# Patient Record
Sex: Male | Born: 2005
Health system: Southern US, Community
[De-identification: ages and names within clinical notes are randomized; demographics above are authoritative.]

## PROBLEM LIST (undated history)

## (undated) DIAGNOSIS — R404 Transient alteration of awareness: Secondary | ICD-10-CM

## (undated) DIAGNOSIS — F909 Attention-deficit hyperactivity disorder, unspecified type: Secondary | ICD-10-CM

## (undated) DIAGNOSIS — R55 Syncope and collapse: Secondary | ICD-10-CM

## (undated) DIAGNOSIS — T7840XA Allergy, unspecified, initial encounter: Secondary | ICD-10-CM

## (undated) DIAGNOSIS — R480 Dyslexia and alexia: Secondary | ICD-10-CM

## (undated) DIAGNOSIS — R278 Other lack of coordination: Secondary | ICD-10-CM

## (undated) DIAGNOSIS — H9325 Central auditory processing disorder: Secondary | ICD-10-CM

## (undated) HISTORY — PX: TYMPANOSTOMY TUBE PLACEMENT: SHX32

## (undated) HISTORY — DX: Transient alteration of awareness: R40.4

## (undated) HISTORY — DX: Other lack of coordination: R27.8

## (undated) HISTORY — DX: Central auditory processing disorder: H93.25

## (undated) HISTORY — PX: CIRCUMCISION REVISION: SHX1347

## (undated) HISTORY — DX: Allergy, unspecified, initial encounter: T78.40XA

## (undated) HISTORY — DX: Syncope and collapse: R55

---
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
## 2024-04-30 ENCOUNTER — Other Ambulatory Visit: Payer: Self-pay

## 2024-04-30 MED ORDER — SCOPOLAMINE 1 MG/3DAYS TD PT72: 1.0000 | MEDICATED_PATCH | TRANSDERMAL | 1 refills | Status: AC
